# Patient Record
Sex: Female | Born: 1955 | Race: White | Hispanic: No | Marital: Married | Smoking: Former smoker
Health system: Southern US, Community
[De-identification: ages and names within clinical notes are randomized; demographics above are authoritative.]

## PROBLEM LIST (undated history)

## (undated) DIAGNOSIS — C801 Malignant (primary) neoplasm, unspecified: Secondary | ICD-10-CM

## (undated) DIAGNOSIS — C4491 Basal cell carcinoma of skin, unspecified: Secondary | ICD-10-CM

## (undated) DIAGNOSIS — N200 Calculus of kidney: Secondary | ICD-10-CM

## (undated) HISTORY — DX: Basal cell carcinoma of skin, unspecified: C44.91

## (undated) HISTORY — DX: Calculus of kidney: N20.0

## (undated) HISTORY — PX: THYROIDECTOMY: SHX17

## (undated) HISTORY — DX: Malignant (primary) neoplasm, unspecified: C80.1

---

## 2000-06-03 DIAGNOSIS — C801 Malignant (primary) neoplasm, unspecified: Secondary | ICD-10-CM

## 2000-06-03 HISTORY — DX: Malignant (primary) neoplasm, unspecified: C80.1

## 2004-04-05 ENCOUNTER — Ambulatory Visit: Payer: Self-pay | Admitting: Oncology

## 2004-05-03 ENCOUNTER — Ambulatory Visit: Payer: Self-pay | Admitting: Oncology

## 2004-08-20 ENCOUNTER — Ambulatory Visit: Payer: Self-pay | Admitting: Oncology

## 2004-09-01 ENCOUNTER — Ambulatory Visit: Payer: Self-pay | Admitting: Oncology

## 2005-01-01 ENCOUNTER — Inpatient Hospital Stay: Payer: Self-pay | Admitting: Internal Medicine

## 2005-04-04 ENCOUNTER — Ambulatory Visit: Payer: Self-pay | Admitting: Oncology

## 2005-04-08 ENCOUNTER — Ambulatory Visit: Payer: Self-pay | Admitting: Gastroenterology

## 2005-04-09 ENCOUNTER — Ambulatory Visit: Payer: Self-pay | Admitting: Gastroenterology

## 2005-05-03 ENCOUNTER — Ambulatory Visit: Payer: Self-pay | Admitting: Oncology

## 2005-12-13 ENCOUNTER — Ambulatory Visit: Payer: Self-pay | Admitting: Oncology

## 2006-01-01 ENCOUNTER — Ambulatory Visit: Payer: Self-pay | Admitting: Oncology

## 2006-06-13 ENCOUNTER — Ambulatory Visit: Payer: Self-pay | Admitting: Oncology

## 2006-07-04 ENCOUNTER — Ambulatory Visit: Payer: Self-pay | Admitting: Oncology

## 2007-04-04 ENCOUNTER — Ambulatory Visit: Payer: Self-pay | Admitting: Oncology

## 2007-04-24 ENCOUNTER — Ambulatory Visit: Payer: Self-pay | Admitting: Oncology

## 2007-05-04 ENCOUNTER — Ambulatory Visit: Payer: Self-pay | Admitting: Oncology

## 2008-04-19 ENCOUNTER — Ambulatory Visit: Payer: Self-pay | Admitting: Oncology

## 2008-04-22 ENCOUNTER — Ambulatory Visit: Payer: Self-pay | Admitting: Oncology

## 2008-05-03 ENCOUNTER — Ambulatory Visit: Payer: Self-pay | Admitting: Oncology

## 2009-04-03 ENCOUNTER — Ambulatory Visit: Payer: Self-pay | Admitting: Oncology

## 2009-04-14 ENCOUNTER — Ambulatory Visit: Payer: Self-pay | Admitting: Oncology

## 2009-05-03 ENCOUNTER — Ambulatory Visit: Payer: Self-pay | Admitting: Oncology

## 2010-04-13 ENCOUNTER — Ambulatory Visit: Payer: Self-pay | Admitting: Oncology

## 2010-05-03 ENCOUNTER — Ambulatory Visit: Payer: Self-pay | Admitting: Oncology

## 2011-04-30 ENCOUNTER — Ambulatory Visit: Payer: Self-pay | Admitting: Oncology

## 2011-05-13 ENCOUNTER — Ambulatory Visit: Payer: Self-pay | Admitting: Oncology

## 2011-06-04 ENCOUNTER — Ambulatory Visit: Payer: Self-pay | Admitting: Oncology

## 2012-06-03 ENCOUNTER — Ambulatory Visit: Payer: Self-pay | Admitting: Oncology

## 2012-06-16 LAB — CBC CANCER CENTER
Basophil %: 1.1 %
Eosinophil %: 2.6 %
HCT: 38.4 % (ref 35.0–47.0)
HGB: 13 g/dL (ref 12.0–16.0)
Lymphocyte #: 1.9 x10 3/mm (ref 1.0–3.6)
Lymphocyte %: 36.9 %
MCH: 30.9 pg (ref 26.0–34.0)
MCHC: 33.7 g/dL (ref 32.0–36.0)
Monocyte %: 5.2 %
Neutrophil #: 2.7 x10 3/mm (ref 1.4–6.5)
Platelet: 298 x10 3/mm (ref 150–440)
RBC: 4.19 10*6/uL (ref 3.80–5.20)

## 2012-06-16 LAB — COMPREHENSIVE METABOLIC PANEL
Alkaline Phosphatase: 72 U/L (ref 50–136)
BUN: 11 mg/dL (ref 7–18)
Bilirubin,Total: 0.4 mg/dL (ref 0.2–1.0)
Chloride: 103 mmol/L (ref 98–107)
Creatinine: 0.9 mg/dL (ref 0.60–1.30)
EGFR (Non-African Amer.): >60
Glucose: 90 mg/dL (ref 65–99)
Osmolality: 278 (ref 275–301)
SGPT (ALT): 20 U/L (ref 12–78)
Sodium: 140 mmol/L (ref 136–145)

## 2012-06-16 LAB — TSH: Thyroid Stimulating Horm: 1.09 u[IU]/mL

## 2012-07-04 ENCOUNTER — Ambulatory Visit: Payer: Self-pay | Admitting: Oncology

## 2013-06-16 ENCOUNTER — Ambulatory Visit: Payer: Self-pay | Admitting: Oncology

## 2013-06-16 LAB — COMPREHENSIVE METABOLIC PANEL
ALBUMIN: 3.8 g/dL (ref 3.4–5.0)
ALT: 21 U/L (ref 12–78)
AST: 20 U/L (ref 15–37)
Alkaline Phosphatase: 66 U/L
Anion Gap: 6 — ABNORMAL LOW (ref 7–16)
BUN: 12 mg/dL (ref 7–18)
Bilirubin,Total: 0.5 mg/dL (ref 0.2–1.0)
CHLORIDE: 104 mmol/L (ref 98–107)
CREATININE: 0.79 mg/dL (ref 0.60–1.30)
Calcium, Total: 7.5 mg/dL — ABNORMAL LOW (ref 8.5–10.1)
Co2: 28 mmol/L (ref 21–32)
Glucose: 153 mg/dL — ABNORMAL HIGH (ref 65–99)
Osmolality: 278 (ref 275–301)
POTASSIUM: 3.4 mmol/L — AB (ref 3.5–5.1)
Sodium: 138 mmol/L (ref 136–145)
Total Protein: 7.2 g/dL (ref 6.4–8.2)

## 2013-06-16 LAB — CBC CANCER CENTER
BASOS PCT: 1.3 %
Basophil #: 0.1 x10 3/mm (ref 0.0–0.1)
EOS ABS: 0.1 x10 3/mm (ref 0.0–0.7)
Eosinophil %: 2.2 %
HCT: 39 % (ref 35.0–47.0)
HGB: 12.8 g/dL (ref 12.0–16.0)
Lymphocyte #: 1.8 x10 3/mm (ref 1.0–3.6)
Lymphocyte %: 35.6 %
MCH: 30 pg (ref 26.0–34.0)
MCHC: 32.7 g/dL (ref 32.0–36.0)
MCV: 92 fL (ref 80–100)
MONOS PCT: 4.1 %
Monocyte #: 0.2 x10 3/mm (ref 0.2–0.9)
Neutrophil #: 2.9 x10 3/mm (ref 1.4–6.5)
Neutrophil %: 56.8 %
Platelet: 323 x10 3/mm (ref 150–440)
RBC: 4.25 10*6/uL (ref 3.80–5.20)
RDW: 13.5 % (ref 11.5–14.5)
WBC: 5 x10 3/mm (ref 3.6–11.0)

## 2013-06-16 LAB — TSH: THYROID STIMULATING HORM: 3.7 u[IU]/mL

## 2013-06-16 LAB — T4, FREE: Free Thyroxine: 1.1 ng/dL (ref 0.76–1.46)

## 2013-07-04 ENCOUNTER — Ambulatory Visit: Payer: Self-pay | Admitting: Oncology

## 2014-06-22 ENCOUNTER — Ambulatory Visit: Payer: Self-pay | Admitting: Oncology

## 2014-06-22 LAB — COMPREHENSIVE METABOLIC PANEL
ALBUMIN: 3.9 g/dL (ref 3.4–5.0)
ANION GAP: 7 (ref 7–16)
Alkaline Phosphatase: 81 U/L
BUN: 12 mg/dL (ref 7–18)
Bilirubin,Total: 0.5 mg/dL (ref 0.2–1.0)
CALCIUM: 7.5 mg/dL — AB (ref 8.5–10.1)
CHLORIDE: 102 mmol/L (ref 98–107)
Co2: 32 mmol/L (ref 21–32)
Creatinine: 0.82 mg/dL (ref 0.60–1.30)
EGFR (African American): >60
EGFR (Non-African Amer.): >60
GLUCOSE: 111 mg/dL — AB (ref 65–99)
Osmolality: 282 (ref 275–301)
POTASSIUM: 3.4 mmol/L — AB (ref 3.5–5.1)
SGOT(AST): 14 U/L — ABNORMAL LOW (ref 15–37)
SGPT (ALT): 19 U/L
SODIUM: 141 mmol/L (ref 136–145)
TOTAL PROTEIN: 7.5 g/dL (ref 6.4–8.2)

## 2014-06-22 LAB — CBC CANCER CENTER
BASOS ABS: 0.1 x10 3/mm (ref 0.0–0.1)
BASOS PCT: 1.2 %
EOS ABS: 0.1 x10 3/mm (ref 0.0–0.7)
EOS PCT: 2.6 %
HCT: 40.1 % (ref 35.0–47.0)
HGB: 13.1 g/dL (ref 12.0–16.0)
LYMPHS ABS: 2 x10 3/mm (ref 1.0–3.6)
Lymphocyte %: 35.9 %
MCH: 28.9 pg (ref 26.0–34.0)
MCHC: 32.6 g/dL (ref 32.0–36.0)
MCV: 89 fL (ref 80–100)
MONO ABS: 0.3 x10 3/mm (ref 0.2–0.9)
Monocyte %: 5.7 %
NEUTROS ABS: 3 x10 3/mm (ref 1.4–6.5)
Neutrophil %: 54.6 %
Platelet: 339 x10 3/mm (ref 150–440)
RBC: 4.52 10*6/uL (ref 3.80–5.20)
RDW: 14 % (ref 11.5–14.5)
WBC: 5.5 x10 3/mm (ref 3.6–11.0)

## 2014-06-22 LAB — TSH: THYROID STIMULATING HORM: 2.02 u[IU]/mL

## 2014-07-04 ENCOUNTER — Ambulatory Visit: Payer: Self-pay | Admitting: Oncology

## 2014-12-01 ENCOUNTER — Other Ambulatory Visit: Payer: Self-pay | Admitting: Oncology

## 2014-12-12 ENCOUNTER — Other Ambulatory Visit: Payer: Self-pay

## 2014-12-16 ENCOUNTER — Inpatient Hospital Stay: Payer: BLUE CROSS/BLUE SHIELD | Attending: Oncology

## 2014-12-16 ENCOUNTER — Other Ambulatory Visit: Payer: Self-pay

## 2014-12-16 DIAGNOSIS — C73 Malignant neoplasm of thyroid gland: Secondary | ICD-10-CM | POA: Diagnosis not present

## 2014-12-16 DIAGNOSIS — Z79899 Other long term (current) drug therapy: Secondary | ICD-10-CM | POA: Insufficient documentation

## 2014-12-16 LAB — TSH: TSH: 0.402 u[IU]/mL (ref 0.350–4.500)

## 2014-12-17 LAB — T4: T4, Total: 12.7 ug/dL — ABNORMAL HIGH (ref 4.5–12.0)

## 2014-12-19 ENCOUNTER — Telehealth: Payer: Self-pay | Admitting: *Deleted

## 2014-12-19 DIAGNOSIS — C73 Malignant neoplasm of thyroid gland: Secondary | ICD-10-CM

## 2014-12-19 MED ORDER — LEVOTHYROXINE SODIUM 125 MCG PO TABS: 125.0000 ug | ORAL_TABLET | Freq: Every day | ORAL | Status: DC

## 2014-12-19 NOTE — Telephone Encounter (Signed)
Elevated T4, synthroid needs to be decreased to 150mcg daily. New prescription has been called into [facility] on [address].

## 2014-12-19 NOTE — Telephone Encounter (Signed)
Attempted to call patient to inform her of TSH results and new prescription - both contact numbers have been disconnected.  Mailed results with note to patient regarding new prescription.

## 2014-12-20 ENCOUNTER — Other Ambulatory Visit: Payer: Self-pay | Admitting: *Deleted

## 2014-12-20 DIAGNOSIS — C73 Malignant neoplasm of thyroid gland: Secondary | ICD-10-CM

## 2015-01-20 ENCOUNTER — Other Ambulatory Visit: Payer: Self-pay | Admitting: *Deleted

## 2015-01-20 DIAGNOSIS — C73 Malignant neoplasm of thyroid gland: Secondary | ICD-10-CM

## 2015-01-20 MED ORDER — LEVOTHYROXINE SODIUM 125 MCG PO TABS: 125.0000 ug | ORAL_TABLET | Freq: Every day | ORAL | Status: DC

## 2015-03-20 ENCOUNTER — Inpatient Hospital Stay: Payer: BLUE CROSS/BLUE SHIELD | Attending: Oncology

## 2015-03-20 DIAGNOSIS — C73 Malignant neoplasm of thyroid gland: Secondary | ICD-10-CM | POA: Insufficient documentation

## 2015-03-20 LAB — TSH: TSH: 1.885 u[IU]/mL (ref 0.350–4.500)

## 2015-03-21 LAB — T4: T4 TOTAL: 10.5 ug/dL (ref 4.5–12.0)

## 2015-04-10 ENCOUNTER — Telehealth: Payer: Self-pay | Admitting: *Deleted

## 2015-04-10 DIAGNOSIS — C73 Malignant neoplasm of thyroid gland: Secondary | ICD-10-CM

## 2015-04-10 MED ORDER — LEVOTHYROXINE SODIUM 125 MCG PO TABS: 125.0000 ug | ORAL_TABLET | Freq: Every day | ORAL | Status: DC

## 2015-04-10 NOTE — Telephone Encounter (Signed)
Escribed

## 2015-06-21 ENCOUNTER — Other Ambulatory Visit: Payer: Self-pay | Admitting: *Deleted

## 2015-06-21 DIAGNOSIS — C73 Malignant neoplasm of thyroid gland: Secondary | ICD-10-CM

## 2015-06-28 ENCOUNTER — Inpatient Hospital Stay (HOSPITAL_BASED_OUTPATIENT_CLINIC_OR_DEPARTMENT_OTHER): Payer: BLUE CROSS/BLUE SHIELD | Admitting: Oncology

## 2015-06-28 ENCOUNTER — Inpatient Hospital Stay: Payer: BLUE CROSS/BLUE SHIELD | Attending: Oncology

## 2015-06-28 VITALS — BP 89/65 | HR 77 | Temp 97.7°F | Resp 18 | Wt 156.5 lb

## 2015-06-28 DIAGNOSIS — Z85850 Personal history of malignant neoplasm of thyroid: Secondary | ICD-10-CM

## 2015-06-28 DIAGNOSIS — Z79899 Other long term (current) drug therapy: Secondary | ICD-10-CM | POA: Diagnosis not present

## 2015-06-28 DIAGNOSIS — C73 Malignant neoplasm of thyroid gland: Secondary | ICD-10-CM

## 2015-06-28 LAB — TSH: TSH: 7.681 u[IU]/mL — AB (ref 0.350–4.500)

## 2015-06-28 NOTE — Progress Notes (Signed)
[place]  Telephone:(336) [phone]  Fax:(336) [phone]     Janet Chung DOB: [birth year]  MR#: [id number]  [id number]  Patient Care Team: Rusty Aus, MD as PCP - General (Internal Medicine)  CHIEF COMPLAINT:  Chief Complaint  Patient presents with  . Thyroid Cancer    INTERVAL HISTORY:  Patient is here for further evaluation and treatment consideration regarding carcinoma of the thyroid from 2003. She overall reports feeling very well and denies any complaints of fever, chills, nausea, vomiting, diarrhea. She is currently on levothyroxine 125 g daily. This dose adjustment was performed in July 2016 following an elevated T4. She reports taking her medication daily first thing in the morning and before she has anything to eat as directed. She overall feels very well and denies any acute complaints other than some head cold with nasal congestion.  REVIEW OF SYSTEMS:   Review of Systems  Constitutional: Negative for fever, chills, weight loss, malaise/fatigue and diaphoresis.  HENT: Positive for congestion.        Related to recent cold  Eyes: Negative.   Respiratory: Negative for cough, hemoptysis, sputum production, shortness of breath and wheezing.   Cardiovascular: Negative for chest pain, palpitations, orthopnea, claudication, leg swelling and PND.  Gastrointestinal: Negative for heartburn, nausea, vomiting, abdominal pain, diarrhea, constipation, blood in stool and melena.  Genitourinary: Negative.   Musculoskeletal: Negative.   Skin: Negative.   Neurological: Positive for headaches. Negative for dizziness, tingling, focal weakness, seizures and weakness.  Endo/Heme/Allergies: Does not bruise/bleed easily.  Psychiatric/Behavioral: Negative for depression. The patient is not nervous/anxious and does not have insomnia.     As per HPI. Otherwise, a complete review of systems is negatve.  ONCOLOGY HISTORY: Oncology History   1. Recently found carcinoma  of thyroid, status post complete thyroidectomy. Tumor measuring more than 4 cm.  Diagnosis in October 2003.  2.  Patient received I-131 therapy on June 04, 2002.  She received 202 mcg of Iodine 131.      Thyroid cancer ([place])   03/17/2002 Initial Diagnosis Thyroid cancer ([place])    PAST MEDICAL HISTORY: No past medical history on file.  PAST SURGICAL HISTORY: No past surgical history on file.  FAMILY HISTORY No family history on file.  GYNECOLOGIC HISTORY:  No LMP recorded.     ADVANCED DIRECTIVES:    HEALTH MAINTENANCE: Social History  Substance Use Topics  . Smoking status: Not on file  . Smokeless tobacco: Not on file  . Alcohol Use: Not on file      Allergies  Allergen Reactions  . Percocet [Oxycodone-Acetaminophen] Other (See Comments)    Percocet - Hallucinations, N/V  . Penicillins Rash    Current Outpatient Prescriptions  Medication Sig Dispense Refill  . levothyroxine (SYNTHROID) 125 MCG tablet Take 1 tablet (125 mcg total) by mouth daily before breakfast. 90 tablet 3   No current facility-administered medications for this visit.    OBJECTIVE: BP 89/65 mmHg  Pulse 77  Temp(Src) 97.7 F (36.5 C) (Tympanic)  Resp 18  Wt 156 lb 8.4 oz (71 kg)   There is no height on file to calculate BMI.    ECOG FS:0 - Asymptomatic  General: Well-developed, well-nourished, no acute distress. Eyes: Pink conjunctiva, anicteric sclera. HEENT: Normocephalic, moist mucous membranes, clear oropharnyx. Lungs: Clear to auscultation bilaterally. Heart: Regular rate and rhythm. No rubs, murmurs, or gallops. Musculoskeletal: No edema, cyanosis, or clubbing. Neuro: Alert, answering all questions appropriately. Cranial nerves grossly intact. Skin: No rashes or  petechiae noted. Psych: Normal affect.   LAB RESULTS:  Appointment on 06/28/2015  Component Date Value Ref Range Status  . TSH 06/28/2015 7.681* 0.350 - 4.500 uIU/mL Final    STUDIES: No results  found.  ASSESSMENT:  Carcinoma of thyroid, greater than 4 cm.  PLAN:   1. Carcinoma of thyroid. Patient is status post complete thyroidectomy from 2003. She also received I-131 therapy on 06/04/2002. She received total dose of 202 g of I-131. Clinically there is no evidence of recurrent disease. Lab results have been reviewed today and TSH is elevated at 7.681. T4 has not been resulted yet, will await these results before adjusting dose. Advised patient that I would contact her personally in the next 1-2 days once T4 had been resulted. We will schedule follow-up at that time.  Patient expressed understanding and was in agreement with this plan. She also understands that She can call clinic at any time with any questions, concerns, or complaints.   Dr. Oliva Bustard was available for consultation and review of plan of care for this patient.    [name], NP   06/28/2015 9:09 AM

## 2015-06-29 ENCOUNTER — Other Ambulatory Visit: Payer: Self-pay | Admitting: Family Medicine

## 2015-06-29 DIAGNOSIS — C73 Malignant neoplasm of thyroid gland: Secondary | ICD-10-CM

## 2015-06-29 LAB — T4: T4 TOTAL: 9.7 ug/dL (ref 4.5–12.0)

## 2015-06-29 LAB — THYROGLOBULIN BY IMA: THYROGLOBULIN BY: 0.5 ng/mL — AB (ref 1.5–38.5)

## 2015-06-29 LAB — TGAB+THYROGLOBULIN IMA OR RIA

## 2015-06-29 NOTE — Progress Notes (Signed)
Lab report received on 06/29/2015.  T4 reported as 9.7 , TSH reported at 7.681. Thyroglobulin antibody reported as less than 1.0 the thyroglobulin by IMA 0.5.  Discussed with Dr. Oliva Bustard , based off T4 report patient should remain on Levothyroxine of 125 g daily.  We'll continue with labs only checked in 6 months and return for next physician follow-up in 12 months.

## 2015-12-28 ENCOUNTER — Inpatient Hospital Stay: Payer: BLUE CROSS/BLUE SHIELD

## 2016-01-02 ENCOUNTER — Inpatient Hospital Stay: Payer: BLUE CROSS/BLUE SHIELD | Attending: Internal Medicine

## 2016-01-02 ENCOUNTER — Other Ambulatory Visit: Payer: Self-pay

## 2016-01-02 DIAGNOSIS — C73 Malignant neoplasm of thyroid gland: Secondary | ICD-10-CM

## 2016-01-02 DIAGNOSIS — Z85850 Personal history of malignant neoplasm of thyroid: Secondary | ICD-10-CM | POA: Diagnosis present

## 2016-01-02 DIAGNOSIS — Z79899 Other long term (current) drug therapy: Secondary | ICD-10-CM | POA: Insufficient documentation

## 2016-01-03 LAB — THYROID PANEL WITH TSH
FREE THYROXINE INDEX: 2.5 (ref 1.2–4.9)
T3 UPTAKE RATIO: 30 % (ref 24–39)
T4, Total: 8.2 ug/dL (ref 4.5–12.0)
TSH: 8.75 u[IU]/mL — ABNORMAL HIGH (ref 0.450–4.500)

## 2016-04-11 ENCOUNTER — Other Ambulatory Visit: Payer: Self-pay | Admitting: Oncology

## 2016-04-11 DIAGNOSIS — C73 Malignant neoplasm of thyroid gland: Secondary | ICD-10-CM

## 2016-04-11 NOTE — Telephone Encounter (Addendum)
She has thyroid cancer and does not have an appt until 06/28/16 on covering provider. She is seen once a year with labs every 6 months  Thyroid Panel With TSH  Order: [id number]  Status:  Final result  Visible to patient:  No (Not Released)  Next appt:  06/28/2016 at 02:30 PM in Oncology ([facility] COVERING PROVIDER)  Dx:  Thyroid cancer ([place])   Ref Range & Units 77mo ago  TSH 0.450 - 4.500 uIU/mL 8.750    T4, Total 4.5 - 12.0 ug/dL 8.2   T3 Uptake Ratio 24 - 39 % 30   Free Thyroxine Index 1.2 - 4.9 2.5   Comments: (NOTE)  Performed At: [facility]  [place], [place] [id number]  Lindon Romp MD [id number]

## 2016-06-27 ENCOUNTER — Ambulatory Visit: Payer: BLUE CROSS/BLUE SHIELD

## 2016-06-28 ENCOUNTER — Encounter: Payer: Self-pay | Admitting: Oncology

## 2016-06-28 ENCOUNTER — Inpatient Hospital Stay: Payer: BLUE CROSS/BLUE SHIELD

## 2016-06-28 ENCOUNTER — Inpatient Hospital Stay: Payer: BLUE CROSS/BLUE SHIELD | Attending: Oncology | Admitting: Oncology

## 2016-06-28 VITALS — BP 114/63 | HR 86 | Temp 98.0°F | Ht 68.0 in | Wt 159.1 lb

## 2016-06-28 DIAGNOSIS — C73 Malignant neoplasm of thyroid gland: Secondary | ICD-10-CM

## 2016-06-28 DIAGNOSIS — Z85850 Personal history of malignant neoplasm of thyroid: Secondary | ICD-10-CM | POA: Diagnosis present

## 2016-06-28 DIAGNOSIS — E890 Postprocedural hypothyroidism: Secondary | ICD-10-CM | POA: Insufficient documentation

## 2016-06-28 DIAGNOSIS — Z79899 Other long term (current) drug therapy: Secondary | ICD-10-CM | POA: Insufficient documentation

## 2016-06-28 LAB — TSH: TSH: 12.733 u[IU]/mL — ABNORMAL HIGH (ref 0.350–4.500)

## 2016-06-28 NOTE — Progress Notes (Signed)
Patient here for follow up no clinical concerns since last check up. She currently has a "cold".

## 2016-06-28 NOTE — Progress Notes (Signed)
Hematology/Oncology Consult note [facility]  [phone] Fax:([phone]  Patient Care Team: Rusty Aus, MD as PCP - General (Internal Medicine)   Name of the patient: Janet Chung  [id number]  1956-01-21   Date of visit: 06/28/16  Diagnosis- thyroid carcinoma  Chief complaint/ Reason for visit- routine follow-up of thyroid carcinoma  Heme/Onc history: Patient is a 61 year old female who has a history of thyroid carcinoma in 2003 status post complete thyroidectomy. She received radioactive iodine treatment on 1 07/05/2002 for a total dose of 202 g. She is currently on 125 g of levothyroxine.  Interval history- doing well. Denies any complaints  ECOG PS- 0 Pain scale- 0 Opioid associated constipation- 0  Review of systems- Review of Systems  Constitutional: Negative for chills, fever, malaise/fatigue and weight loss.  HENT: Negative for congestion, ear discharge and nosebleeds.   Eyes: Negative for blurred vision.  Respiratory: Negative for cough, hemoptysis, sputum production, shortness of breath and wheezing.   Cardiovascular: Negative for chest pain, palpitations, orthopnea and claudication.  Gastrointestinal: Negative for abdominal pain, blood in stool, constipation, diarrhea, heartburn, melena, nausea and vomiting.  Genitourinary: Negative for dysuria, flank pain, frequency, hematuria and urgency.  Musculoskeletal: Negative for back pain, joint pain and myalgias.  Skin: Negative for rash.  Neurological: Negative for dizziness, tingling, focal weakness, seizures, weakness and headaches.  Endo/Heme/Allergies: Does not bruise/bleed easily.  Psychiatric/Behavioral: Negative for depression and suicidal ideas. The patient does not have insomnia.      Current treatment- observation Allergies  Allergen Reactions  . Percocet [Oxycodone-Acetaminophen] Other (See Comments)    Percocet - Hallucinations, N/V  . Penicillins Rash      Past Medical History:  Diagnosis Date  . thyroid      Past Surgical History:  Procedure Laterality Date  . THYROIDECTOMY      Social History   Social History  . Marital status: Married    Spouse name: N/A  . Number of children: N/A  . Years of education: N/A   Occupational History  . Not on file.   Social History Main Topics  . Smoking status: Not on file  . Smokeless tobacco: Not on file  . Alcohol use Not on file  . Drug use: Unknown  . Sexual activity: Not on file   Other Topics Concern  . Not on file   Social History Narrative  . No narrative on file    No family history on file.   Current Outpatient Prescriptions:  .  levothyroxine (SYNTHROID, LEVOTHROID) 125 MCG tablet, TAKE 1 TABLET DAILY BEFORE BREAKFAST, Disp: 90 tablet, Rfl: 3  Physical exam:  Vitals:   06/28/16 1436  BP: 114/63  Pulse: 86  Temp: 98 F (36.7 C)  TempSrc: Oral  Weight: 159 lb 1 oz (72.1 kg)  Height: 5\' 8"  (1.727 m)   Physical Exam  Constitutional: She is oriented to person, place, and time and well-developed, well-nourished, and in no distress.  HENT:  Head: Normocephalic and atraumatic.  Eyes: EOM are normal. Pupils are equal, round, and reactive to light.  Neck: Normal range of motion.  Cardiovascular: Normal rate, regular rhythm and normal heart sounds.   Pulmonary/Chest: Effort normal and breath sounds normal.  Abdominal: Soft. Bowel sounds are normal.  Neurological: She is alert and oriented to person, place, and time.  Skin: Skin is warm and dry.     CMP Latest Ref Rng & Units 06/22/2014  Glucose 65 - 99 mg/dL 111(H)  BUN 7 - 18 mg/dL 12  Creatinine 0.60 - 1.30 mg/dL 0.82  Sodium 136 - 145 mmol/L 141  Potassium 3.5 - 5.1 mmol/L 3.4(L)  Chloride 98 - 107 mmol/L 102  CO2 21 - 32 mmol/L 32  Calcium 8.5 - 10.1 mg/dL 7.5(L)  Total Protein 6.4 - 8.2 g/dL 7.5  Total Bilirubin 0.2 - 1.0 mg/dL 0.5  Alkaline Phos Unit/L 81  AST 15 - 37 Unit/L 14(L)  ALT U/L 19    CBC Latest Ref Rng & Units 06/22/2014  WBC 3.6 - 11.0 x10 3/mm  5.5  Hemoglobin 12.0 - 16.0 g/dL 13.1  Hematocrit 35.0 - 47.0 % 40.1  Platelets 150 - 440 x10 3/mm  339      Assessment and plan- Patient is a 61 y.o. female the history of thyroid carcinoma in 2003 status post total thyroidectomy and radioactive iodine treatment  1. Clinically patient is doing well and there is no evidence of recurrence on today's exam. We will check TSH T3 and T4 today along with thyroglobulin antibody. I will see her back in one year's time with labs. Patient does not have a regular follow-up with her PCP and has not seen him for years and would therefore like to follow-up with [place].   Visit Diagnosis 1. Thyroid cancer ([place])      Dr. Randa Evens, MD, MPH [facility] at [facility] Pager- [phone] 06/28/2016 1:03 PM

## 2016-06-29 LAB — T3: T3, Total: 68 ng/dL — ABNORMAL LOW (ref 71–180)

## 2016-06-29 LAB — T4: T4 TOTAL: 7.8 ug/dL (ref 4.5–12.0)

## 2016-07-01 ENCOUNTER — Telehealth: Payer: Self-pay | Admitting: *Deleted

## 2016-07-01 NOTE — Telephone Encounter (Signed)
Pt long time thyroid cancer 2003, Dr. Janese Banks wanted to know if endocrinology could get yearly labs and change thyroid medicine as needed based on labs.  Called [facility] and was told to fax the records and labs with your request and they will cal me back with response

## 2016-07-03 LAB — THYROGLOBULIN LEVEL: Thyroglobulin: <2 ng/mL

## 2016-07-08 ENCOUNTER — Telehealth: Payer: Self-pay | Admitting: *Deleted

## 2016-07-08 NOTE — Telephone Encounter (Signed)
Called and left message with pt that her tsh level are elevated and when they are with thyroid Dr Janese Banks sends her pt to endocrinology who specializes in adjusting thyroid medicine. I even left message that my mom has thyroid cancer and she is managed by endocrinology and no oncology visits necessary.  Wanted to see if she is agreeable to this and to call me. I left number in [place] for today and then number in [place] for rest of week.  The home phone number listed is not working number.

## 2016-07-09 ENCOUNTER — Telehealth: Payer: Self-pay | Admitting: *Deleted

## 2016-07-09 ENCOUNTER — Other Ambulatory Visit: Payer: Self-pay | Admitting: *Deleted

## 2016-07-09 NOTE — Telephone Encounter (Signed)
Pt called to say that she spoke to endocrinology but the first appt 3/3 and she does not want to wait that long to get any med. Adjustment.  I told her that I would check with Dr. Janese Banks and get back with her. Per Janese Banks she does not want to make changes, just have pt see the specialist 3/3, can call to see if they have cancellations.  I did call [facility]. And no cancellations as of today. I called pt back and told her that rao said to wait and I can put her on cancellation list at endocrine and she is ok with that starting Friday of this week. That is wen she will be back in town from work. I will call in am

## 2016-07-18 DIAGNOSIS — Z1231 Encounter for screening mammogram for malignant neoplasm of breast: Secondary | ICD-10-CM | POA: Diagnosis not present

## 2016-07-19 DIAGNOSIS — Z85828 Personal history of other malignant neoplasm of skin: Secondary | ICD-10-CM | POA: Diagnosis not present

## 2016-07-19 DIAGNOSIS — C44619 Basal cell carcinoma of skin of left upper limb, including shoulder: Secondary | ICD-10-CM | POA: Diagnosis not present

## 2016-07-19 DIAGNOSIS — D485 Neoplasm of uncertain behavior of skin: Secondary | ICD-10-CM | POA: Diagnosis not present

## 2016-08-02 DIAGNOSIS — E890 Postprocedural hypothyroidism: Secondary | ICD-10-CM | POA: Diagnosis not present

## 2016-08-02 DIAGNOSIS — Z85850 Personal history of malignant neoplasm of thyroid: Secondary | ICD-10-CM | POA: Diagnosis not present

## 2016-08-19 ENCOUNTER — Encounter: Payer: Self-pay | Admitting: Obstetrics & Gynecology

## 2016-08-19 ENCOUNTER — Ambulatory Visit (INDEPENDENT_AMBULATORY_CARE_PROVIDER_SITE_OTHER): Payer: BLUE CROSS/BLUE SHIELD | Admitting: Obstetrics & Gynecology

## 2016-08-19 VITALS — BP 130/80 | HR 81 | Ht 67.0 in | Wt 156.0 lb

## 2016-08-19 DIAGNOSIS — Z0000 Encounter for general adult medical examination without abnormal findings: Secondary | ICD-10-CM

## 2016-08-19 NOTE — Progress Notes (Signed)
HPI:      [name] is a 61 y.o. [id number] who LMP was in the past, she presents today for her annual examination.  The patient has no complaints today. The patient is sexually active. last pap: was normal. The patient is not taking hormone replacement therapy. Patient denies post-menopausal vaginal bleeding.   The patient has regular exercise: yes.   GYN Hx: Last Colonoscopy:10 years ago. Normal.  Last DEXA: not done.  PMHx: She  has a past medical history of Basal cell carcinoma and thyroid. Also,  has a past surgical history that includes Thyroidectomy., family history includes Cancer in her brother and mother.,  reports that she quit smoking about 18 years ago. She has a 10.00 pack-year smoking history. She has never used smokeless tobacco. She reports that she drinks about 3.0 oz of alcohol per week . She reports that she does not use drugs.  She has a current medication list which includes the following prescription(s): levothyroxine. Also, is allergic to percocet [oxycodone-acetaminophen] and penicillins.  Review of Systems  Constitutional: Negative for chills, fever and malaise/fatigue.  HENT: Negative for congestion, sinus pain and sore throat.   Eyes: Negative for blurred vision and pain.  Respiratory: Negative for cough and wheezing.   Cardiovascular: Negative for chest pain and leg swelling.  Gastrointestinal: Negative for abdominal pain, constipation, diarrhea, heartburn, nausea and vomiting.  Genitourinary: Negative for dysuria, frequency, hematuria and urgency.  Musculoskeletal: Negative for back pain, joint pain, myalgias and neck pain.  Skin: Negative for itching and rash.  Neurological: Negative for dizziness, tremors and weakness.  Endo/Heme/Allergies: Does not bruise/bleed easily.  Psychiatric/Behavioral: Negative for depression. The patient is not nervous/anxious and does not have insomnia.     Objective: BP 130/80   Pulse 81   Ht 5\' 7"  (1.702 m)   Wt 156  lb (70.8 kg)   LMP  (LMP Unknown)   BMI 24.43 kg/m  Physical Exam  Constitutional: She is oriented to person, place, and time. She appears well-developed and well-nourished. No distress.  Genitourinary: Rectum normal, vagina normal and uterus normal. Pelvic exam was performed with patient supine. There is no rash or lesion on the right labia. There is no rash or lesion on the left labia. Vagina exhibits no lesion. No bleeding in the vagina. Right adnexum does not display mass and does not display tenderness. Left adnexum does not display mass and does not display tenderness. Cervix does not exhibit motion tenderness, lesion, friability or polyp.   Uterus is mobile and midaxial. Uterus is not enlarged or exhibiting a mass.  HENT:  Head: Normocephalic and atraumatic. Head is without laceration.  Right Ear: Hearing normal.  Left Ear: Hearing normal.  Nose: No epistaxis.  No foreign bodies.  Mouth/Throat: Uvula is midline, oropharynx is clear and moist and mucous membranes are normal.  Eyes: Pupils are equal, round, and reactive to light.  Neck: Normal range of motion. Neck supple. No thyromegaly present.  Cardiovascular: Normal rate and regular rhythm.  Exam reveals no gallop and no friction rub.   No murmur heard. Pulmonary/Chest: Effort normal and breath sounds normal. No respiratory distress. She has no wheezes.  Abdominal: Soft. Bowel sounds are normal. She exhibits no distension. There is no tenderness. There is no rebound.  Musculoskeletal: Normal range of motion.  Neurological: She is alert and oriented to person, place, and time. No cranial nerve deficit.  Skin: Skin is warm and dry.  Psychiatric: She has a normal mood and  affect. Judgment normal.  Vitals reviewed.   Assessment: Annual Exam 1. Annual physical exam    Plan:            1.  Cervical Screening-  Pap smear done today  2. Breast screening- Exam annually and mammogram UTD last month.  3. Due for Colonoscopy   4.  Labs per PCP / work  5. Counseling for hormonal therapy: none     F/U  Return for Annual.  Barnett Applebaum, MD, [facility], [facility] 08/19/2016  9:36 AM

## 2016-08-20 ENCOUNTER — Encounter: Payer: Self-pay | Admitting: *Deleted

## 2016-08-20 ENCOUNTER — Telehealth: Payer: Self-pay | Admitting: Obstetrics & Gynecology

## 2016-08-20 NOTE — Telephone Encounter (Signed)
Patient is aware of eval appt with Dr Bary Castilla at [facility] on Wed, 08/28/16 @ 4:00pm with a 3:45pm arrival. Patient is aware paperwork will be mailed and she should bring ins/id/specialty copay/meds in original bottles. Patient was given the location and phone#.

## 2016-08-22 LAB — IGP, APTIMA HPV
HPV APTIMA: NEGATIVE
PAP SMEAR COMMENT: 0

## 2016-08-28 ENCOUNTER — Encounter: Payer: Self-pay | Admitting: General Surgery

## 2016-08-28 ENCOUNTER — Ambulatory Visit (INDEPENDENT_AMBULATORY_CARE_PROVIDER_SITE_OTHER): Payer: BLUE CROSS/BLUE SHIELD | Admitting: General Surgery

## 2016-08-28 VITALS — BP 102/66 | HR 72 | Resp 12 | Ht 67.0 in | Wt 157.0 lb

## 2016-08-28 DIAGNOSIS — Z1211 Encounter for screening for malignant neoplasm of colon: Secondary | ICD-10-CM | POA: Diagnosis not present

## 2016-08-28 MED ORDER — POLYETHYLENE GLYCOL 3350 17 GM/SCOOP PO POWD: 1.0000 | Freq: Once | ORAL | 0 refills | Status: AC

## 2016-08-28 NOTE — Patient Instructions (Signed)
Colonoscopy, Adult A colonoscopy is an exam to look at the entire large intestine. During the exam, a lubricated, bendable tube is inserted into the anus and then passed into the rectum, colon, and other parts of the large intestine. A colonoscopy is often done as a part of normal colorectal screening or in response to certain symptoms, such as anemia, persistent diarrhea, abdominal pain, and blood in the stool. The exam can help screen for and diagnose medical problems, including:  Tumors.  Polyps.  Inflammation.  Areas of bleeding. Tell a health care provider about:  Any allergies you have.  All medicines you are taking, including vitamins, herbs, eye drops, creams, and over-the-counter medicines.  Any problems you or family members have had with anesthetic medicines.  Any blood disorders you have.  Any surgeries you have had.  Any medical conditions you have.  Any problems you have had passing stool. What are the risks? Generally, this is a safe procedure. However, problems may occur, including:  Bleeding.  A tear in the intestine.  A reaction to medicines given during the exam.  Infection (rare). What happens before the procedure? Eating and drinking restrictions  Follow instructions from your health care provider about eating and drinking, which may include:  A few days before the procedure - follow a low-fiber diet. Avoid nuts, seeds, dried fruit, raw fruits, and vegetables.  1-3 days before the procedure - follow a clear liquid diet. Drink only clear liquids, such as clear broth or bouillon, black coffee or tea, clear juice, clear soft drinks or sports drinks, gelatin dessert, and popsicles. Avoid any liquids that contain red or purple dye.  On the day of the procedure - do not eat or drink anything during the 2 hours before the procedure, or within the time period that your health care provider recommends. Bowel prep  If you were prescribed an oral bowel prep to  clean out your colon:  Take it as told by your health care provider. Starting the day before your procedure, you will need to drink a large amount of medicated liquid. The liquid will cause you to have multiple loose stools until your stool is almost clear or light green.  If your skin or anus gets irritated from diarrhea, you may use these to relieve the irritation:  Medicated wipes, such as adult wet wipes with aloe and vitamin E.  A skin soothing-product like petroleum jelly.  If you vomit while drinking the bowel prep, take a break for up to 60 minutes and then begin the bowel prep again. If vomiting continues and you cannot take the bowel prep without vomiting, call your health care provider. General instructions   Ask your health care provider about changing or stopping your regular medicines. This is especially important if you are taking diabetes medicines or blood thinners.  Plan to have someone take you home from the hospital or clinic. What happens during the procedure?  An IV tube may be inserted into one of your veins.  You will be given medicine to help you relax (sedative).  To reduce your risk of infection:  Your health care team will wash or sanitize their hands.  Your anal area will be washed with soap.  You will be asked to lie on your side with your knees bent.  Your health care provider will lubricate a long, thin, flexible tube. The tube will have a camera and a light on the end.  The tube will be inserted into your anus.    The tube will be gently eased through your rectum and colon.  Air will be delivered into your colon to keep it open. You may feel some pressure or cramping.  The camera will be used to take images during the procedure.  A small tissue sample may be removed from your body to be examined under a microscope (biopsy). If any potential problems are found, the tissue will be sent to a lab for testing.  If small polyps are found, your health  care provider may remove them and have them checked for cancer cells.  The tube that was inserted into your anus will be slowly removed. The procedure may vary among health care providers and hospitals. What happens after the procedure?  Your blood pressure, heart rate, breathing rate, and blood oxygen level will be monitored until the medicines you were given have worn off.  Do not drive for 24 hours after the exam.  You may have a small amount of blood in your stool.  You may pass gas and have mild abdominal cramping or bloating due to the air that was used to inflate your colon during the exam.  It is up to you to get the results of your procedure. Ask your health care provider, or the department performing the procedure, when your results will be ready. This information is not intended to replace advice given to you by your health care provider. Make sure you discuss any questions you have with your health care provider. Document Released: 05/17/2000 Document Revised: 03/20/2016 Document Reviewed: 08/01/2015 Elsevier Interactive Patient Education  2017 [employer]  

## 2016-08-28 NOTE — Progress Notes (Addendum)
Patient ID: Janet Chung, female   DOB: 22-Jun-1955, 61 y.o.   MRN: [id number]  Chief Complaint  Patient presents with  . Colonoscopy    HPI Janet Chung is a 61 y.o. female.  Who presents for a colonoscopy discussion. The last colonoscopy was completed at least 10 years ago. Denies any gastrointestinal issues. Bowels move regular and no bleeding noted.  HPI  Past Medical History:  Diagnosis Date  . Basal cell carcinoma   . Kidney stone 2008?  Marland Kitchen thyroid 2002   radioactive iodine therapy    Past Surgical History:  Procedure Laterality Date  . THYROIDECTOMY  2002?    Family History  Problem Relation Age of Onset  . Cancer Mother     cancer  . Cancer Brother     multiple myloma  . Stroke Father     Social History Social History  Substance Use Topics  . Smoking status: Former Smoker    Packs/day: 1.00    Years: 10.00    Quit date: 06/28/1998  . Smokeless tobacco: Never Used  . Alcohol use 3.0 oz/week    5 Cans of beer per week    Allergies  Allergen Reactions  . Percocet [Oxycodone-Acetaminophen] Other (See Comments)    Percocet - Hallucinations, N/V  . Penicillins Rash    Current Outpatient Prescriptions  Medication Sig Dispense Refill  . Doxylamine Succinate, Sleep, (SLEEP AID PO) Take by mouth as needed.    Marland Kitchen levothyroxine (SYNTHROID, LEVOTHROID) 137 MCG tablet Take 137 mcg by mouth daily before breakfast.    . naproxen sodium (ANAPROX) 220 MG tablet Take 220 mg by mouth as needed.    Marland Kitchen omeprazole (PRILOSEC) 20 MG capsule Take 20 mg by mouth as needed.    . polyethylene glycol powder (GLYCOLAX/MIRALAX) powder Take 255 g by mouth once. 255 grams one bottle for colonoscopy prep 255 g 0   No current facility-administered medications for this visit.     Review of Systems Review of Systems  Constitutional: Negative.   Respiratory: Negative.   Cardiovascular: Negative.   Gastrointestinal: Negative for anal bleeding, blood in stool, constipation, diarrhea  and nausea.    Blood pressure 102/66, pulse 72, resp. rate 12, height 5\' 7"  (1.702 m), weight 157 lb (71.2 kg).  Physical Exam Physical Exam  Constitutional: She is oriented to person, place, and time. She appears well-developed and well-nourished.  Cardiovascular: Normal rate, regular rhythm and normal heart sounds.   Pulmonary/Chest: Effort normal and breath sounds normal.  Neurological: She is alert and oriented to person, place, and time.  Skin: Skin is warm and dry.  Psychiatric: Her behavior is normal.       Assessment    Candidate for screening colonoscopy.    Plan         Colonoscopy with possible biopsy/polypectomy prn: Information regarding the procedure, including its potential risks and complications (including but not limited to perforation of the bowel, which may require emergency surgery to repair, and bleeding) was verbally given to the patient. Educational information regarding lower intestinal endoscopy was given to the patient. Written instructions for how to complete the bowel prep using Miralax were provided. The importance of drinking ample fluids to avoid dehydration as a result of the prep emphasized.  Patient has been scheduled for a colonoscopy on 10-16-16 at [facility].   This information has been scribed by Janet Fetch RN, BSN,BC.    Robert Bellow 08/28/2016, 9:11 PM

## 2016-09-12 DIAGNOSIS — C44619 Basal cell carcinoma of skin of left upper limb, including shoulder: Secondary | ICD-10-CM | POA: Diagnosis not present

## 2016-10-07 ENCOUNTER — Telehealth: Payer: Self-pay | Admitting: *Deleted

## 2016-10-07 NOTE — Telephone Encounter (Signed)
Patient called the office wanting to postpone colonoscopy until September 2018.  This patient will be contacted once the schedule is available to reschedule colonoscopy.   Vicky in Endoscopy has been notified of cancellation.

## 2016-10-11 DIAGNOSIS — E890 Postprocedural hypothyroidism: Secondary | ICD-10-CM | POA: Diagnosis not present

## 2016-10-11 DIAGNOSIS — Z85850 Personal history of malignant neoplasm of thyroid: Secondary | ICD-10-CM | POA: Diagnosis not present

## 2016-10-16 ENCOUNTER — Encounter: Admission: RE | Payer: Self-pay | Source: Ambulatory Visit

## 2016-10-16 ENCOUNTER — Ambulatory Visit
Admission: RE | Admit: 2016-10-16 | Payer: BLUE CROSS/BLUE SHIELD | Source: Ambulatory Visit | Admitting: General Surgery

## 2016-10-16 SURGERY — COLONOSCOPY WITH PROPOFOL
Anesthesia: General

## 2016-11-25 DIAGNOSIS — B0230 Zoster ocular disease, unspecified: Secondary | ICD-10-CM | POA: Diagnosis not present

## 2016-12-02 DIAGNOSIS — B0230 Zoster ocular disease, unspecified: Secondary | ICD-10-CM | POA: Diagnosis not present

## 2016-12-05 ENCOUNTER — Encounter: Payer: Self-pay | Admitting: *Deleted

## 2016-12-05 NOTE — Telephone Encounter (Signed)
Message left for patient to call the office.   My Chart message also sent.   We now have the September schedule available and can get her colonoscopy arranged.

## 2016-12-12 NOTE — Telephone Encounter (Signed)
Another message left for patient letting her know that we have the September schedule available and she can schedule her colonoscopy at her convenience.

## 2017-02-06 DIAGNOSIS — Z0000 Encounter for general adult medical examination without abnormal findings: Secondary | ICD-10-CM | POA: Diagnosis not present

## 2017-02-12 DIAGNOSIS — Z85828 Personal history of other malignant neoplasm of skin: Secondary | ICD-10-CM | POA: Diagnosis not present

## 2017-03-03 DIAGNOSIS — B0230 Zoster ocular disease, unspecified: Secondary | ICD-10-CM | POA: Diagnosis not present

## 2017-03-21 DIAGNOSIS — D649 Anemia, unspecified: Secondary | ICD-10-CM | POA: Diagnosis not present

## 2017-04-22 DIAGNOSIS — Z1211 Encounter for screening for malignant neoplasm of colon: Secondary | ICD-10-CM | POA: Diagnosis not present

## 2017-05-05 DIAGNOSIS — Z1211 Encounter for screening for malignant neoplasm of colon: Secondary | ICD-10-CM | POA: Diagnosis not present

## 2017-06-23 ENCOUNTER — Telehealth: Payer: Self-pay | Admitting: *Deleted

## 2017-06-23 NOTE — Telephone Encounter (Signed)
-----   Message from Sindy Guadeloupe, MD sent at 06/19/2017  8:47 PM EST ----- Regarding: RE: dismissal? Contact: [phone] Yes I am ok that she follows up with endocrinology. She can be referred to [place] if any questions or concerns arise ----- Message ----- From: Luella Cook, RN Sent: 06/19/2017   4:44 PM To: Sindy Guadeloupe, MD Subject: FW: dismissal?                                 Are you ok with cancelling her appt.  You wanted her to be managed by endocrine and she sees the endocrine md and they are making changes with synthroid.  Let me know ----- Message ----- From: Elouise Munroe Sent: 06/19/2017   1:39 PM To: Luella Cook, RN, Sindy Guadeloupe, MD Subject: dismissal?                                     Pt left a VM about her appt next week that she would cancel b/c we dismissed her as a pt???

## 2017-06-23 NOTE — Telephone Encounter (Signed)
Patient had called to say that she was cancelling appt because she was dismissed from practice.  She had appt to see pt. Back in 1 year.  She has been going to endocrinology and has future appt f/u so Dr. Janese Banks is fine with her not returning to clinic.

## 2017-06-24 ENCOUNTER — Inpatient Hospital Stay: Payer: BLUE CROSS/BLUE SHIELD

## 2017-06-24 ENCOUNTER — Inpatient Hospital Stay: Payer: BLUE CROSS/BLUE SHIELD | Admitting: Oncology

## 2017-10-30 DIAGNOSIS — R5382 Chronic fatigue, unspecified: Secondary | ICD-10-CM | POA: Diagnosis not present

## 2017-10-30 DIAGNOSIS — E538 Deficiency of other specified B group vitamins: Secondary | ICD-10-CM | POA: Diagnosis not present

## 2017-10-30 DIAGNOSIS — K210 Gastro-esophageal reflux disease with esophagitis: Secondary | ICD-10-CM | POA: Diagnosis not present

## 2017-10-30 DIAGNOSIS — R0789 Other chest pain: Secondary | ICD-10-CM | POA: Diagnosis not present

## 2017-10-30 DIAGNOSIS — D649 Anemia, unspecified: Secondary | ICD-10-CM | POA: Diagnosis not present

## 2017-10-30 DIAGNOSIS — E039 Hypothyroidism, unspecified: Secondary | ICD-10-CM | POA: Diagnosis not present

## 2017-10-31 DIAGNOSIS — X32XXXA Exposure to sunlight, initial encounter: Secondary | ICD-10-CM | POA: Diagnosis not present

## 2017-10-31 DIAGNOSIS — L570 Actinic keratosis: Secondary | ICD-10-CM | POA: Diagnosis not present

## 2017-10-31 DIAGNOSIS — L858 Other specified epidermal thickening: Secondary | ICD-10-CM | POA: Diagnosis not present

## 2017-11-21 DIAGNOSIS — M5416 Radiculopathy, lumbar region: Secondary | ICD-10-CM | POA: Diagnosis not present

## 2017-11-21 DIAGNOSIS — M79604 Pain in right leg: Secondary | ICD-10-CM | POA: Diagnosis not present

## 2017-12-15 ENCOUNTER — Other Ambulatory Visit: Payer: Self-pay | Admitting: Physical Medicine and Rehabilitation

## 2017-12-15 DIAGNOSIS — M5416 Radiculopathy, lumbar region: Secondary | ICD-10-CM | POA: Diagnosis not present

## 2017-12-15 DIAGNOSIS — G8929 Other chronic pain: Secondary | ICD-10-CM | POA: Diagnosis not present

## 2017-12-15 DIAGNOSIS — M5441 Lumbago with sciatica, right side: Secondary | ICD-10-CM | POA: Diagnosis not present

## 2017-12-15 DIAGNOSIS — M79604 Pain in right leg: Secondary | ICD-10-CM | POA: Diagnosis not present

## 2017-12-16 DIAGNOSIS — M25551 Pain in right hip: Secondary | ICD-10-CM | POA: Diagnosis not present

## 2017-12-16 DIAGNOSIS — M545 Low back pain: Secondary | ICD-10-CM | POA: Diagnosis not present

## 2017-12-16 DIAGNOSIS — M25651 Stiffness of right hip, not elsewhere classified: Secondary | ICD-10-CM | POA: Diagnosis not present

## 2017-12-18 DIAGNOSIS — M25651 Stiffness of right hip, not elsewhere classified: Secondary | ICD-10-CM | POA: Diagnosis not present

## 2017-12-18 DIAGNOSIS — M25551 Pain in right hip: Secondary | ICD-10-CM | POA: Diagnosis not present

## 2017-12-18 DIAGNOSIS — M545 Low back pain: Secondary | ICD-10-CM | POA: Diagnosis not present

## 2017-12-23 DIAGNOSIS — M25551 Pain in right hip: Secondary | ICD-10-CM | POA: Diagnosis not present

## 2017-12-23 DIAGNOSIS — M545 Low back pain: Secondary | ICD-10-CM | POA: Diagnosis not present

## 2017-12-23 DIAGNOSIS — M25651 Stiffness of right hip, not elsewhere classified: Secondary | ICD-10-CM | POA: Diagnosis not present

## 2017-12-25 ENCOUNTER — Ambulatory Visit
Admission: RE | Admit: 2017-12-25 | Discharge: 2017-12-25 | Disposition: A | Discharge status: Home / Self Care | Payer: BLUE CROSS/BLUE SHIELD | Source: Ambulatory Visit | Attending: Physical Medicine and Rehabilitation | Admitting: Physical Medicine and Rehabilitation

## 2017-12-25 DIAGNOSIS — M5416 Radiculopathy, lumbar region: Secondary | ICD-10-CM | POA: Insufficient documentation

## 2017-12-25 DIAGNOSIS — M48061 Spinal stenosis, lumbar region without neurogenic claudication: Secondary | ICD-10-CM | POA: Diagnosis not present

## 2017-12-25 DIAGNOSIS — M5136 Other intervertebral disc degeneration, lumbar region: Secondary | ICD-10-CM | POA: Insufficient documentation

## 2017-12-25 DIAGNOSIS — M545 Low back pain: Secondary | ICD-10-CM | POA: Diagnosis not present

## 2017-12-25 DIAGNOSIS — M25551 Pain in right hip: Secondary | ICD-10-CM | POA: Diagnosis not present

## 2017-12-25 DIAGNOSIS — M25651 Stiffness of right hip, not elsewhere classified: Secondary | ICD-10-CM | POA: Diagnosis not present

## 2017-12-29 DIAGNOSIS — M545 Low back pain: Secondary | ICD-10-CM | POA: Diagnosis not present

## 2017-12-29 DIAGNOSIS — M25651 Stiffness of right hip, not elsewhere classified: Secondary | ICD-10-CM | POA: Diagnosis not present

## 2017-12-29 DIAGNOSIS — M25551 Pain in right hip: Secondary | ICD-10-CM | POA: Diagnosis not present

## 2017-12-31 DIAGNOSIS — M25551 Pain in right hip: Secondary | ICD-10-CM | POA: Diagnosis not present

## 2017-12-31 DIAGNOSIS — M545 Low back pain: Secondary | ICD-10-CM | POA: Diagnosis not present

## 2017-12-31 DIAGNOSIS — M25651 Stiffness of right hip, not elsewhere classified: Secondary | ICD-10-CM | POA: Diagnosis not present

## 2018-01-08 DIAGNOSIS — M25551 Pain in right hip: Secondary | ICD-10-CM | POA: Diagnosis not present

## 2018-01-08 DIAGNOSIS — M545 Low back pain: Secondary | ICD-10-CM | POA: Diagnosis not present

## 2018-01-08 DIAGNOSIS — M25651 Stiffness of right hip, not elsewhere classified: Secondary | ICD-10-CM | POA: Diagnosis not present

## 2018-01-13 DIAGNOSIS — M25651 Stiffness of right hip, not elsewhere classified: Secondary | ICD-10-CM | POA: Diagnosis not present

## 2018-01-13 DIAGNOSIS — M545 Low back pain: Secondary | ICD-10-CM | POA: Diagnosis not present

## 2018-01-13 DIAGNOSIS — M25551 Pain in right hip: Secondary | ICD-10-CM | POA: Diagnosis not present

## 2018-01-15 DIAGNOSIS — M545 Low back pain: Secondary | ICD-10-CM | POA: Diagnosis not present

## 2018-01-15 DIAGNOSIS — M25651 Stiffness of right hip, not elsewhere classified: Secondary | ICD-10-CM | POA: Diagnosis not present

## 2018-01-15 DIAGNOSIS — M25551 Pain in right hip: Secondary | ICD-10-CM | POA: Diagnosis not present

## 2018-01-20 DIAGNOSIS — M25651 Stiffness of right hip, not elsewhere classified: Secondary | ICD-10-CM | POA: Diagnosis not present

## 2018-01-20 DIAGNOSIS — M545 Low back pain: Secondary | ICD-10-CM | POA: Diagnosis not present

## 2018-01-20 DIAGNOSIS — M25551 Pain in right hip: Secondary | ICD-10-CM | POA: Diagnosis not present

## 2018-01-22 DIAGNOSIS — M545 Low back pain: Secondary | ICD-10-CM | POA: Diagnosis not present

## 2018-01-22 DIAGNOSIS — M25551 Pain in right hip: Secondary | ICD-10-CM | POA: Diagnosis not present

## 2018-01-22 DIAGNOSIS — M25651 Stiffness of right hip, not elsewhere classified: Secondary | ICD-10-CM | POA: Diagnosis not present

## 2018-01-26 DIAGNOSIS — M5416 Radiculopathy, lumbar region: Secondary | ICD-10-CM | POA: Diagnosis not present

## 2018-01-26 DIAGNOSIS — M545 Low back pain: Secondary | ICD-10-CM | POA: Diagnosis not present

## 2018-01-26 DIAGNOSIS — M25651 Stiffness of right hip, not elsewhere classified: Secondary | ICD-10-CM | POA: Diagnosis not present

## 2018-01-26 DIAGNOSIS — M25551 Pain in right hip: Secondary | ICD-10-CM | POA: Diagnosis not present

## 2018-01-26 DIAGNOSIS — M5136 Other intervertebral disc degeneration, lumbar region: Secondary | ICD-10-CM | POA: Diagnosis not present

## 2018-02-04 DIAGNOSIS — Z131 Encounter for screening for diabetes mellitus: Secondary | ICD-10-CM | POA: Diagnosis not present

## 2018-02-04 DIAGNOSIS — Z1389 Encounter for screening for other disorder: Secondary | ICD-10-CM | POA: Diagnosis not present

## 2018-02-04 DIAGNOSIS — Z0000 Encounter for general adult medical examination without abnormal findings: Secondary | ICD-10-CM | POA: Diagnosis not present

## 2018-02-09 DIAGNOSIS — Z0000 Encounter for general adult medical examination without abnormal findings: Secondary | ICD-10-CM | POA: Diagnosis not present

## 2018-02-12 DIAGNOSIS — M545 Low back pain: Secondary | ICD-10-CM | POA: Diagnosis not present

## 2018-02-12 DIAGNOSIS — M25551 Pain in right hip: Secondary | ICD-10-CM | POA: Diagnosis not present

## 2018-02-12 DIAGNOSIS — M25651 Stiffness of right hip, not elsewhere classified: Secondary | ICD-10-CM | POA: Diagnosis not present

## 2018-02-16 DIAGNOSIS — Z1231 Encounter for screening mammogram for malignant neoplasm of breast: Secondary | ICD-10-CM | POA: Diagnosis not present

## 2018-02-17 DIAGNOSIS — M25651 Stiffness of right hip, not elsewhere classified: Secondary | ICD-10-CM | POA: Diagnosis not present

## 2018-02-17 DIAGNOSIS — M545 Low back pain: Secondary | ICD-10-CM | POA: Diagnosis not present

## 2018-02-17 DIAGNOSIS — M25551 Pain in right hip: Secondary | ICD-10-CM | POA: Diagnosis not present

## 2018-02-26 DIAGNOSIS — M545 Low back pain: Secondary | ICD-10-CM | POA: Diagnosis not present

## 2018-02-26 DIAGNOSIS — M25551 Pain in right hip: Secondary | ICD-10-CM | POA: Diagnosis not present

## 2018-02-26 DIAGNOSIS — M25651 Stiffness of right hip, not elsewhere classified: Secondary | ICD-10-CM | POA: Diagnosis not present

## 2018-03-02 DIAGNOSIS — M25551 Pain in right hip: Secondary | ICD-10-CM | POA: Diagnosis not present

## 2018-03-02 DIAGNOSIS — M545 Low back pain: Secondary | ICD-10-CM | POA: Diagnosis not present

## 2018-03-02 DIAGNOSIS — M25651 Stiffness of right hip, not elsewhere classified: Secondary | ICD-10-CM | POA: Diagnosis not present

## 2018-03-03 DIAGNOSIS — M5136 Other intervertebral disc degeneration, lumbar region: Secondary | ICD-10-CM | POA: Diagnosis not present

## 2018-03-03 DIAGNOSIS — M5416 Radiculopathy, lumbar region: Secondary | ICD-10-CM | POA: Diagnosis not present

## 2018-03-05 DIAGNOSIS — Z08 Encounter for follow-up examination after completed treatment for malignant neoplasm: Secondary | ICD-10-CM | POA: Diagnosis not present

## 2018-03-05 DIAGNOSIS — Z85828 Personal history of other malignant neoplasm of skin: Secondary | ICD-10-CM | POA: Diagnosis not present

## 2018-03-20 DIAGNOSIS — M25551 Pain in right hip: Secondary | ICD-10-CM | POA: Diagnosis not present

## 2018-03-20 DIAGNOSIS — M545 Low back pain: Secondary | ICD-10-CM | POA: Diagnosis not present

## 2018-03-20 DIAGNOSIS — M25651 Stiffness of right hip, not elsewhere classified: Secondary | ICD-10-CM | POA: Diagnosis not present

## 2018-03-31 DIAGNOSIS — M5136 Other intervertebral disc degeneration, lumbar region: Secondary | ICD-10-CM | POA: Diagnosis not present

## 2018-03-31 DIAGNOSIS — M5416 Radiculopathy, lumbar region: Secondary | ICD-10-CM | POA: Diagnosis not present

## 2018-04-21 DIAGNOSIS — M5416 Radiculopathy, lumbar region: Secondary | ICD-10-CM | POA: Diagnosis not present

## 2018-04-21 DIAGNOSIS — M5136 Other intervertebral disc degeneration, lumbar region: Secondary | ICD-10-CM | POA: Diagnosis not present

## 2018-07-29 DIAGNOSIS — S61314A Laceration without foreign body of right ring finger with damage to nail, initial encounter: Secondary | ICD-10-CM | POA: Diagnosis not present

## 2018-07-29 DIAGNOSIS — Z23 Encounter for immunization: Secondary | ICD-10-CM | POA: Diagnosis not present

## 2018-08-04 DIAGNOSIS — Z0000 Encounter for general adult medical examination without abnormal findings: Secondary | ICD-10-CM | POA: Diagnosis not present

## 2018-08-04 DIAGNOSIS — Z131 Encounter for screening for diabetes mellitus: Secondary | ICD-10-CM | POA: Diagnosis not present

## 2018-08-10 DIAGNOSIS — M519 Unspecified thoracic, thoracolumbar and lumbosacral intervertebral disc disorder: Secondary | ICD-10-CM | POA: Diagnosis not present

## 2018-08-10 DIAGNOSIS — E039 Hypothyroidism, unspecified: Secondary | ICD-10-CM | POA: Diagnosis not present

## 2018-08-10 DIAGNOSIS — E538 Deficiency of other specified B group vitamins: Secondary | ICD-10-CM | POA: Diagnosis not present

## 2019-02-09 DIAGNOSIS — E538 Deficiency of other specified B group vitamins: Secondary | ICD-10-CM | POA: Diagnosis not present

## 2019-02-09 DIAGNOSIS — E039 Hypothyroidism, unspecified: Secondary | ICD-10-CM | POA: Diagnosis not present

## 2019-02-15 DIAGNOSIS — E538 Deficiency of other specified B group vitamins: Secondary | ICD-10-CM | POA: Diagnosis not present

## 2019-02-15 DIAGNOSIS — Z0000 Encounter for general adult medical examination without abnormal findings: Secondary | ICD-10-CM | POA: Diagnosis not present

## 2019-02-23 DIAGNOSIS — Z1231 Encounter for screening mammogram for malignant neoplasm of breast: Secondary | ICD-10-CM | POA: Diagnosis not present

## 2019-03-11 DIAGNOSIS — D2261 Melanocytic nevi of right upper limb, including shoulder: Secondary | ICD-10-CM | POA: Diagnosis not present

## 2019-03-11 DIAGNOSIS — D2262 Melanocytic nevi of left upper limb, including shoulder: Secondary | ICD-10-CM | POA: Diagnosis not present

## 2019-03-11 DIAGNOSIS — D485 Neoplasm of uncertain behavior of skin: Secondary | ICD-10-CM | POA: Diagnosis not present

## 2019-03-11 DIAGNOSIS — Z85828 Personal history of other malignant neoplasm of skin: Secondary | ICD-10-CM | POA: Diagnosis not present

## 2019-03-11 DIAGNOSIS — C44519 Basal cell carcinoma of skin of other part of trunk: Secondary | ICD-10-CM | POA: Diagnosis not present

## 2019-03-11 DIAGNOSIS — D2272 Melanocytic nevi of left lower limb, including hip: Secondary | ICD-10-CM | POA: Diagnosis not present

## 2019-04-07 DIAGNOSIS — R922 Inconclusive mammogram: Secondary | ICD-10-CM | POA: Diagnosis not present

## 2019-04-07 DIAGNOSIS — N6312 Unspecified lump in the right breast, upper inner quadrant: Secondary | ICD-10-CM | POA: Diagnosis not present

## 2019-04-07 DIAGNOSIS — R928 Other abnormal and inconclusive findings on diagnostic imaging of breast: Secondary | ICD-10-CM | POA: Diagnosis not present

## 2019-04-07 DIAGNOSIS — N6311 Unspecified lump in the right breast, upper outer quadrant: Secondary | ICD-10-CM | POA: Diagnosis not present

## 2019-04-12 DIAGNOSIS — H524 Presbyopia: Secondary | ICD-10-CM | POA: Diagnosis not present

## 2020-03-30 IMAGING — MR MR LUMBAR SPINE W/O CM
4 of 5 series · 25 of 48 positions shown · non-contrast
Comparison: Small-bowel follow-through 04/09/2005. CT Abdomen and
Pelvis 01/02/2005.

CLINICAL DATA: 62-year-old female with intermittent lumbar back
pain radiating to the right leg and ankle for 6 months. Right knee
numbness and tingling. No known injury.

EXAM:
MRI LUMBAR SPINE WITHOUT CONTRAST
TECHNIQUE: Multiplanar, multisequence MR imaging of the lumbar spine was
performed. No intravenous contrast was administered.

[Series 2: T2 · sagittal · 4.0mm · 0.81mm/px · 6 of 15 slices shown (1 of 2)]
[im 1/15]
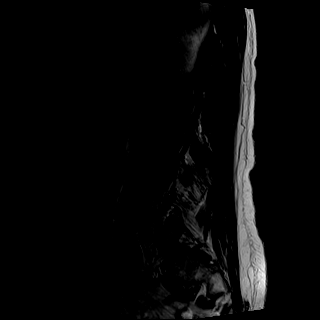
[im 3/15]
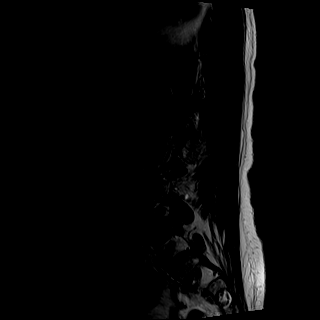
[im 6/15]
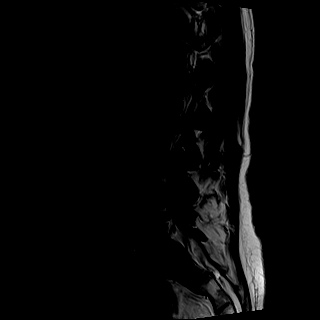
[im 9/15]
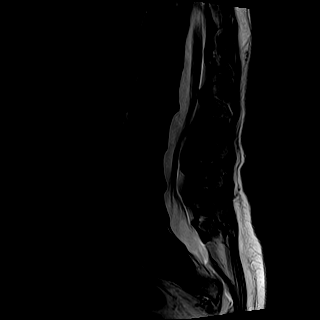
[im 12/15]
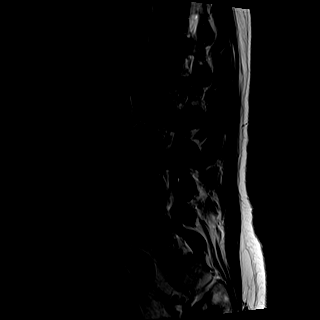
[im 15/15]
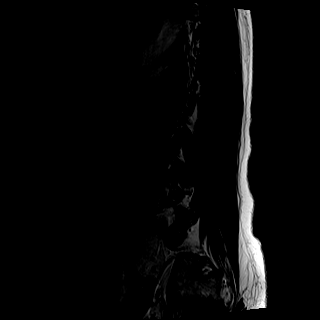

[Series 3: T1 · sagittal · 4.0mm · 0.41mm/px · 6 of 15 slices shown (1 of 2)]
[im 1/15]
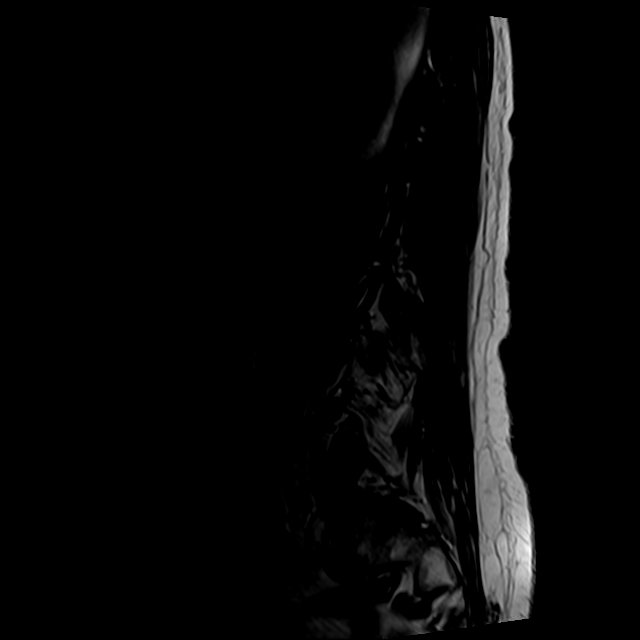
[im 3/15]
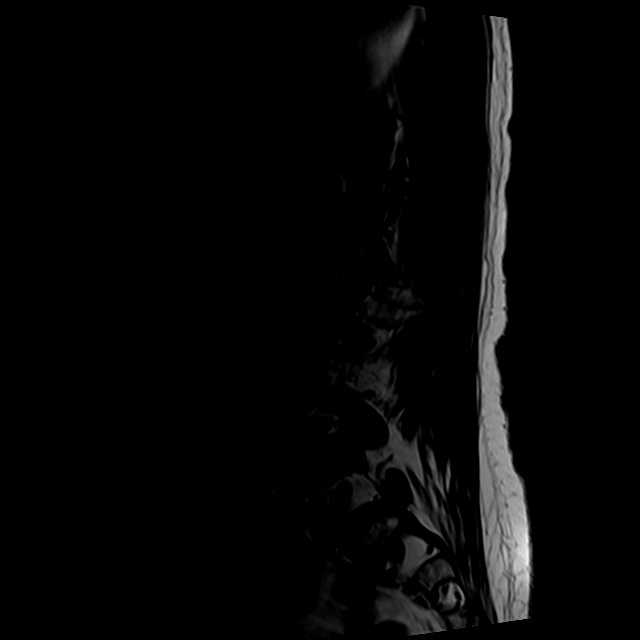
[im 6/15]
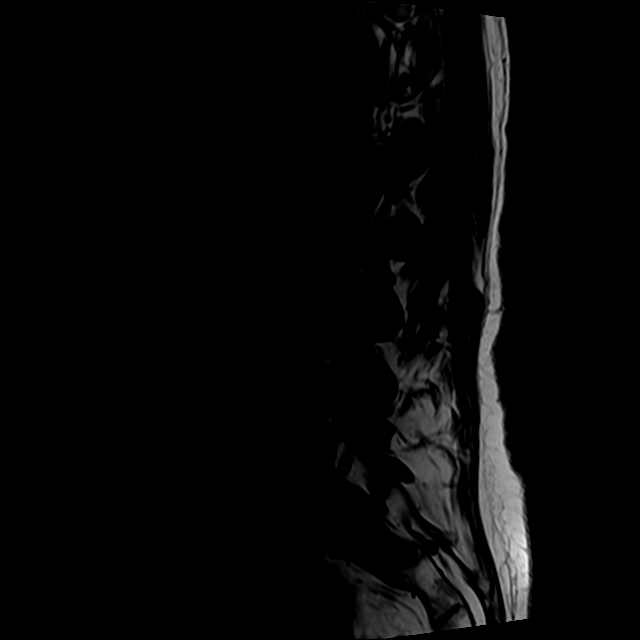
[im 9/15]
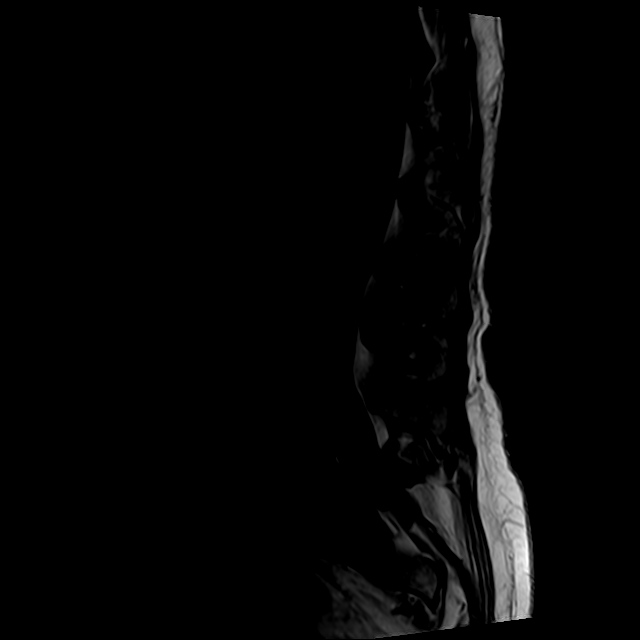
[im 12/15]
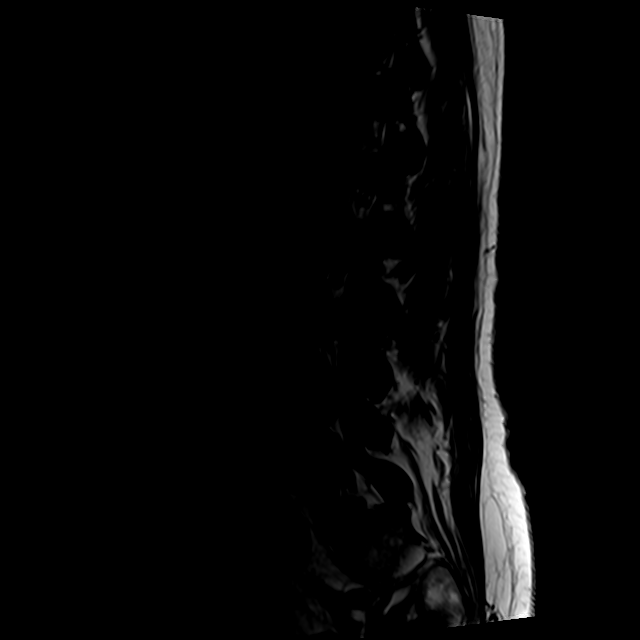
[im 15/15]
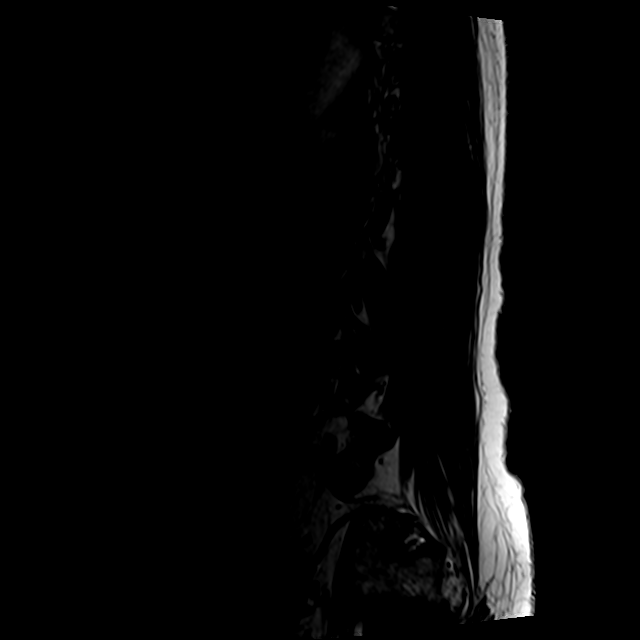

[Series 5: T2 · axial · 4.0mm · 0.78mm/px · z∈[-182,+10]mm · 9 of 38 slices shown (2 of 2)]
[im 1/38]
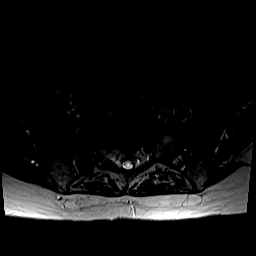
[im 6/38]
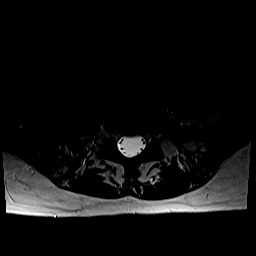
[im 11/38]
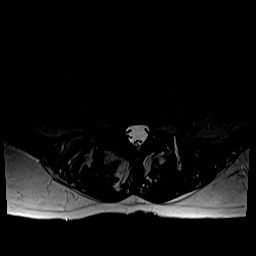
[im 16/38]
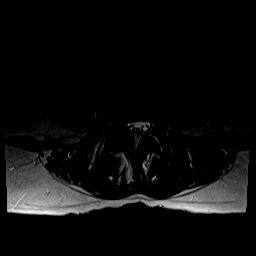
[im 19/38]
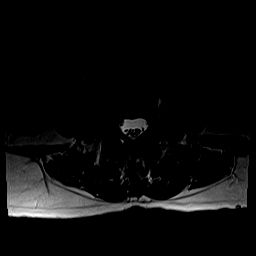
[im 22/38]
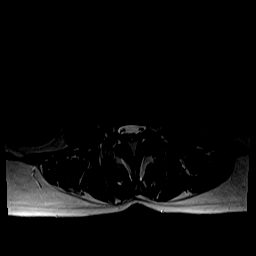
[im 27/38]
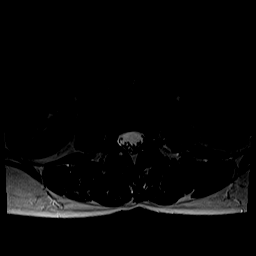
[im 32/38]
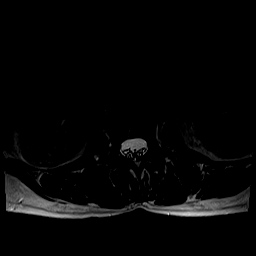
[im 38/38]
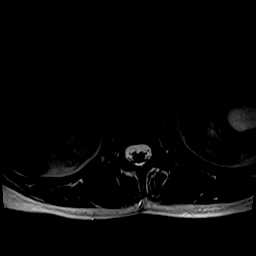

[Series 6: T1 · axial · 4.0mm · 0.39mm/px · z∈[-182,-20]mm · 4 of 38 slices shown (2 of 2)]
[im 1/38]
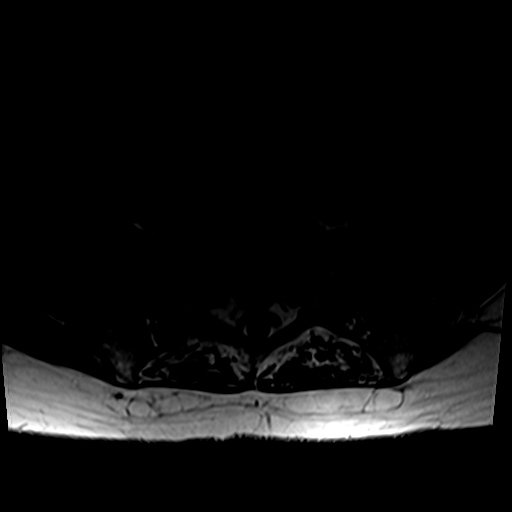
[im 6/38]
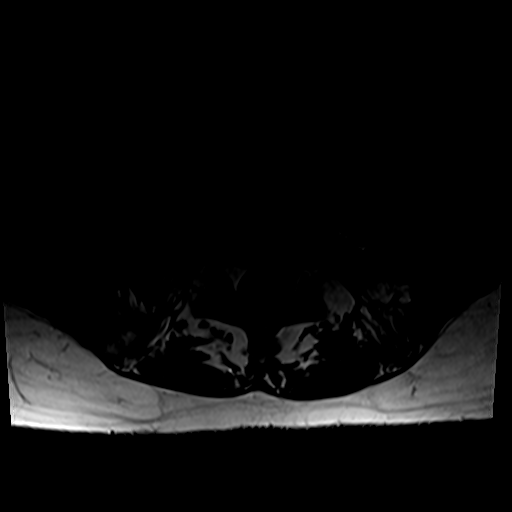
[im 19/38]
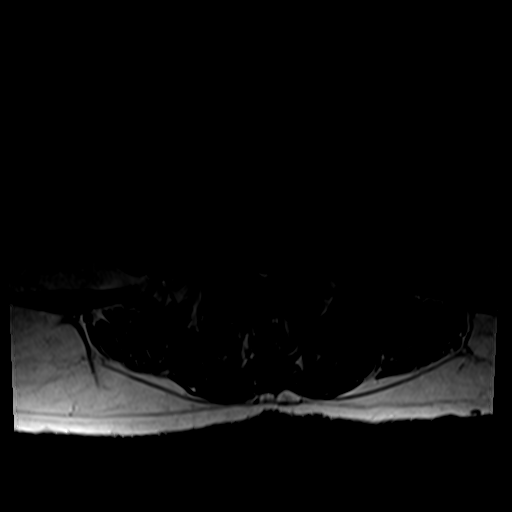
[im 32/38]
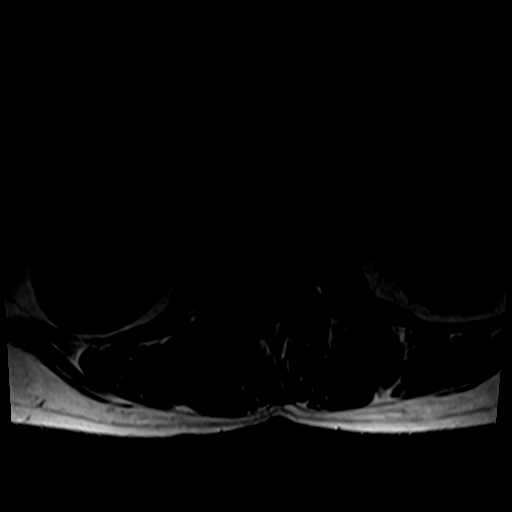

[25 of 48 positions shown; findings below may reference images not displayed]

FINDINGS: Segmentation: The lowest ribs are full size and suspected to be at
T12 with a partially lumbarized S1 level; right side fused S1-S2
assimilation joint. Full size S1-S2 disc space. Correlation with
radiographs is recommended prior to any operative intervention.

Alignment: Mild straightening of lumbar lordosis with mild
retrolisthesis at L2-L3, L4-L5, L5-S1.

Vertebrae: Normal background bone marrow signal. Mild acute but
mostly chronic degenerative endplate marrow signal changes at L4-L5.
No other acute osseous abnormality identified. Intact visible sacrum
and SI joints.

Conus medullaris and cauda equina: Conus extends to the L2 level. No
lower spinal cord or conus signal abnormality.

Paraspinal and other soft tissues: Benign appearing left renal
midpole cyst. Otherwise negative visible abdominal viscera. Negative
visualized posterior paraspinal soft tissues.

Disc levels:

T12-L1:  Negative.

L1-L2:  Negative.

L2-L3: Disc desiccation and disc space loss. Circumferential but
mostly far lateral disc bulging and endplate spurring. No stenosis.

L3-L4: Mild disc desiccation and circumferential disc bulging. Mild
facet hypertrophy. No spinal or lateral recess stenosis. No
foraminal stenosis.

L4-L5: Disc desiccation and probable vacuum disc. Circumferential
disc bulge and endplate spurring eccentric to the right. Broad-based
right subarticular component. Moderate facet and ligament flavum
hypertrophy. Trace degenerative left facet joint fluid. Borderline
to mild spinal stenosis and right lateral recess stenosis (right L5
nerve level). Borderline to mild left and mild right L4 foraminal
stenosis.

L5-S1: Disc desiccation and disc space loss. Right eccentric
circumferential disc bulge and endplate spurring. Mild to moderate
facet hypertrophy. No spinal or convincing lateral recess stenosis.
Mild left and mild to moderate right L5 neural foraminal stenosis.

S1-S2: Normal disc.  Negative.
IMPRESSION: 1. Transitional lumbosacral anatomy. A partially lumbarized S1 level
is designated for the purposes of this report with the lowest ribs
at T12. Correlation with radiographs is recommended prior to any
operative intervention.
2. Right eccentric moderately to severe disc and endplate
degeneration at L4-L5 and L5-S1. Query right L4 and/or L5
radiculitis. Up to mild associated spinal stenosis at L4-L5.
3. Chronic disc degeneration at L2-L3 without stenosis.

## 2023-12-31 DIAGNOSIS — D2261 Melanocytic nevi of right upper limb, including shoulder: Secondary | ICD-10-CM | POA: Diagnosis not present

## 2023-12-31 DIAGNOSIS — D2262 Melanocytic nevi of left upper limb, including shoulder: Secondary | ICD-10-CM | POA: Diagnosis not present

## 2023-12-31 DIAGNOSIS — D2272 Melanocytic nevi of left lower limb, including hip: Secondary | ICD-10-CM | POA: Diagnosis not present

## 2023-12-31 DIAGNOSIS — D225 Melanocytic nevi of trunk: Secondary | ICD-10-CM | POA: Diagnosis not present

## 2023-12-31 DIAGNOSIS — Z85828 Personal history of other malignant neoplasm of skin: Secondary | ICD-10-CM | POA: Diagnosis not present

## 2024-02-23 DIAGNOSIS — Z0000 Encounter for general adult medical examination without abnormal findings: Secondary | ICD-10-CM | POA: Diagnosis not present

## 2024-03-01 DIAGNOSIS — R739 Hyperglycemia, unspecified: Secondary | ICD-10-CM | POA: Diagnosis not present

## 2024-03-01 DIAGNOSIS — Z0000 Encounter for general adult medical examination without abnormal findings: Secondary | ICD-10-CM | POA: Diagnosis not present

## 2024-03-01 DIAGNOSIS — Z79899 Other long term (current) drug therapy: Secondary | ICD-10-CM | POA: Diagnosis not present

## 2024-03-01 DIAGNOSIS — E538 Deficiency of other specified B group vitamins: Secondary | ICD-10-CM | POA: Diagnosis not present

## 2024-03-01 DIAGNOSIS — E782 Mixed hyperlipidemia: Secondary | ICD-10-CM | POA: Diagnosis not present

## 2024-03-10 DIAGNOSIS — Z1231 Encounter for screening mammogram for malignant neoplasm of breast: Secondary | ICD-10-CM | POA: Diagnosis not present

## 2024-03-18 DIAGNOSIS — M9903 Segmental and somatic dysfunction of lumbar region: Secondary | ICD-10-CM | POA: Diagnosis not present

## 2024-03-18 DIAGNOSIS — M62830 Muscle spasm of back: Secondary | ICD-10-CM | POA: Diagnosis not present

## 2024-03-18 DIAGNOSIS — M9904 Segmental and somatic dysfunction of sacral region: Secondary | ICD-10-CM | POA: Diagnosis not present

## 2024-03-18 DIAGNOSIS — M5416 Radiculopathy, lumbar region: Secondary | ICD-10-CM | POA: Diagnosis not present

## 2024-03-19 DIAGNOSIS — M62830 Muscle spasm of back: Secondary | ICD-10-CM | POA: Diagnosis not present

## 2024-03-19 DIAGNOSIS — M9904 Segmental and somatic dysfunction of sacral region: Secondary | ICD-10-CM | POA: Diagnosis not present

## 2024-03-19 DIAGNOSIS — M9903 Segmental and somatic dysfunction of lumbar region: Secondary | ICD-10-CM | POA: Diagnosis not present

## 2024-03-19 DIAGNOSIS — M5416 Radiculopathy, lumbar region: Secondary | ICD-10-CM | POA: Diagnosis not present

## 2024-03-22 DIAGNOSIS — M5416 Radiculopathy, lumbar region: Secondary | ICD-10-CM | POA: Diagnosis not present

## 2024-03-22 DIAGNOSIS — M62830 Muscle spasm of back: Secondary | ICD-10-CM | POA: Diagnosis not present

## 2024-03-22 DIAGNOSIS — M9904 Segmental and somatic dysfunction of sacral region: Secondary | ICD-10-CM | POA: Diagnosis not present

## 2024-03-22 DIAGNOSIS — M9903 Segmental and somatic dysfunction of lumbar region: Secondary | ICD-10-CM | POA: Diagnosis not present

## 2024-03-23 DIAGNOSIS — M5416 Radiculopathy, lumbar region: Secondary | ICD-10-CM | POA: Diagnosis not present

## 2024-03-23 DIAGNOSIS — M9904 Segmental and somatic dysfunction of sacral region: Secondary | ICD-10-CM | POA: Diagnosis not present

## 2024-03-23 DIAGNOSIS — M9903 Segmental and somatic dysfunction of lumbar region: Secondary | ICD-10-CM | POA: Diagnosis not present

## 2024-03-23 DIAGNOSIS — M62830 Muscle spasm of back: Secondary | ICD-10-CM | POA: Diagnosis not present

## 2024-03-26 DIAGNOSIS — M5416 Radiculopathy, lumbar region: Secondary | ICD-10-CM | POA: Diagnosis not present

## 2024-03-26 DIAGNOSIS — M9904 Segmental and somatic dysfunction of sacral region: Secondary | ICD-10-CM | POA: Diagnosis not present

## 2024-03-26 DIAGNOSIS — M9903 Segmental and somatic dysfunction of lumbar region: Secondary | ICD-10-CM | POA: Diagnosis not present

## 2024-03-26 DIAGNOSIS — M62830 Muscle spasm of back: Secondary | ICD-10-CM | POA: Diagnosis not present

## 2024-03-29 DIAGNOSIS — M5416 Radiculopathy, lumbar region: Secondary | ICD-10-CM | POA: Diagnosis not present

## 2024-03-29 DIAGNOSIS — M62830 Muscle spasm of back: Secondary | ICD-10-CM | POA: Diagnosis not present

## 2024-03-29 DIAGNOSIS — M9903 Segmental and somatic dysfunction of lumbar region: Secondary | ICD-10-CM | POA: Diagnosis not present

## 2024-03-29 DIAGNOSIS — M9904 Segmental and somatic dysfunction of sacral region: Secondary | ICD-10-CM | POA: Diagnosis not present

## 2024-03-31 DIAGNOSIS — M9904 Segmental and somatic dysfunction of sacral region: Secondary | ICD-10-CM | POA: Diagnosis not present

## 2024-03-31 DIAGNOSIS — M5416 Radiculopathy, lumbar region: Secondary | ICD-10-CM | POA: Diagnosis not present

## 2024-03-31 DIAGNOSIS — M62830 Muscle spasm of back: Secondary | ICD-10-CM | POA: Diagnosis not present

## 2024-03-31 DIAGNOSIS — M9903 Segmental and somatic dysfunction of lumbar region: Secondary | ICD-10-CM | POA: Diagnosis not present

## 2024-04-02 DIAGNOSIS — M9903 Segmental and somatic dysfunction of lumbar region: Secondary | ICD-10-CM | POA: Diagnosis not present

## 2024-04-02 DIAGNOSIS — M62830 Muscle spasm of back: Secondary | ICD-10-CM | POA: Diagnosis not present

## 2024-04-02 DIAGNOSIS — M9904 Segmental and somatic dysfunction of sacral region: Secondary | ICD-10-CM | POA: Diagnosis not present

## 2024-04-02 DIAGNOSIS — M5416 Radiculopathy, lumbar region: Secondary | ICD-10-CM | POA: Diagnosis not present

## 2024-04-05 DIAGNOSIS — M9904 Segmental and somatic dysfunction of sacral region: Secondary | ICD-10-CM | POA: Diagnosis not present

## 2024-04-05 DIAGNOSIS — M5416 Radiculopathy, lumbar region: Secondary | ICD-10-CM | POA: Diagnosis not present

## 2024-04-05 DIAGNOSIS — M62830 Muscle spasm of back: Secondary | ICD-10-CM | POA: Diagnosis not present

## 2024-04-05 DIAGNOSIS — M9903 Segmental and somatic dysfunction of lumbar region: Secondary | ICD-10-CM | POA: Diagnosis not present

## 2024-04-08 DIAGNOSIS — M9903 Segmental and somatic dysfunction of lumbar region: Secondary | ICD-10-CM | POA: Diagnosis not present

## 2024-04-08 DIAGNOSIS — M62830 Muscle spasm of back: Secondary | ICD-10-CM | POA: Diagnosis not present

## 2024-04-08 DIAGNOSIS — M9904 Segmental and somatic dysfunction of sacral region: Secondary | ICD-10-CM | POA: Diagnosis not present

## 2024-04-08 DIAGNOSIS — M5416 Radiculopathy, lumbar region: Secondary | ICD-10-CM | POA: Diagnosis not present

## 2024-04-13 DIAGNOSIS — M62830 Muscle spasm of back: Secondary | ICD-10-CM | POA: Diagnosis not present

## 2024-04-13 DIAGNOSIS — M9904 Segmental and somatic dysfunction of sacral region: Secondary | ICD-10-CM | POA: Diagnosis not present

## 2024-04-13 DIAGNOSIS — M5416 Radiculopathy, lumbar region: Secondary | ICD-10-CM | POA: Diagnosis not present

## 2024-04-13 DIAGNOSIS — M9903 Segmental and somatic dysfunction of lumbar region: Secondary | ICD-10-CM | POA: Diagnosis not present

## 2024-04-15 DIAGNOSIS — M62830 Muscle spasm of back: Secondary | ICD-10-CM | POA: Diagnosis not present

## 2024-04-15 DIAGNOSIS — M9903 Segmental and somatic dysfunction of lumbar region: Secondary | ICD-10-CM | POA: Diagnosis not present

## 2024-04-15 DIAGNOSIS — M5416 Radiculopathy, lumbar region: Secondary | ICD-10-CM | POA: Diagnosis not present

## 2024-04-15 DIAGNOSIS — M9904 Segmental and somatic dysfunction of sacral region: Secondary | ICD-10-CM | POA: Diagnosis not present

## 2024-04-21 DIAGNOSIS — M5416 Radiculopathy, lumbar region: Secondary | ICD-10-CM | POA: Diagnosis not present

## 2024-04-21 DIAGNOSIS — M9903 Segmental and somatic dysfunction of lumbar region: Secondary | ICD-10-CM | POA: Diagnosis not present

## 2024-04-21 DIAGNOSIS — M62830 Muscle spasm of back: Secondary | ICD-10-CM | POA: Diagnosis not present

## 2024-04-21 DIAGNOSIS — M9904 Segmental and somatic dysfunction of sacral region: Secondary | ICD-10-CM | POA: Diagnosis not present

## 2024-05-04 DIAGNOSIS — M62830 Muscle spasm of back: Secondary | ICD-10-CM | POA: Diagnosis not present

## 2024-05-04 DIAGNOSIS — M9904 Segmental and somatic dysfunction of sacral region: Secondary | ICD-10-CM | POA: Diagnosis not present

## 2024-05-04 DIAGNOSIS — M5416 Radiculopathy, lumbar region: Secondary | ICD-10-CM | POA: Diagnosis not present

## 2024-05-04 DIAGNOSIS — M9903 Segmental and somatic dysfunction of lumbar region: Secondary | ICD-10-CM | POA: Diagnosis not present
